# Patient Record
Sex: Male | Born: 2005 | Race: White | Hispanic: No | Marital: Single | Smoking: Never smoker
Health system: Southern US, Community
[De-identification: ages and names within clinical notes are randomized; demographics above are authoritative.]

## PROBLEM LIST (undated history)

## (undated) DIAGNOSIS — F909 Attention-deficit hyperactivity disorder, unspecified type: Secondary | ICD-10-CM

## (undated) HISTORY — DX: Attention-deficit hyperactivity disorder, unspecified type: F90.9

---
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
## 2024-01-02 ENCOUNTER — Ambulatory Visit: Payer: Self-pay

## 2024-01-02 NOTE — Telephone Encounter (Signed)
 Chief Complaint: Burning with urination Symptoms: burning with urination Frequency: comes and goes onset Monday  Pertinent Negatives: Patient denies fever, pus, discharge, nausea, vomiting Disposition: [] ED /[] Urgent Care (no appt availability in office) / [x] Appointment(In office/virtual)/ []  [facility]/ [] Home Care/ [] Refused Recommended Disposition /[] [facility]/ []  Follow-up with PCP Additional Notes: Patient states he is experiencing burning with urination that started on Monday. Patient denies all other symptoms. Care advice was given and appointment has been scheduled for evaluation tomorrow morning at 1005.    Copied from CRM [id number]. Topic: Clinical - Red Word Triage >> Jan 02, 2024  4:28 PM Emylou G wrote: [facility] that prompted transfer to Nurse Triage: Burning when urinates Reason for Disposition  All other males with painful urination  Answer Assessment - Initial Assessment Questions 1. SEVERITY: "How bad is the pain?"  (e.g., Scale 1-10; mild, moderate, or severe)   - MILD (1-3): Complains slightly about urination hurting.   - MODERATE (4-7): Interferes with normal activities.     - SEVERE (8-10): Excruciating, unwilling or unable to urinate because of the pain.      Moderated  2. FREQUENCY: "How many times have you had painful urination today?"      4-5 3. PATTERN: "Is pain present every time you urinate or just sometimes?"      Sometimes  4. ONSET: "When did the painful urination start?"      Monday  5. FEVER: "Do you have a fever?" If Yes, ask: "What is your temperature, how was it measured, and when did it start?"     No  6. PAST UTI: "Have you had a urine infection before?" If Yes, ask: "When was the last time?" and "What happened that time?"      No 7. CAUSE: "What do you think is causing the painful urination?"      Maybe a UTI 8. OTHER SYMPTOMS: "Do you have any other symptoms?" (e.g., flank pain, penis discharge, scrotal pain, blood in  urine)     Burning,  Protocols used: Urination Pain - Male-A-AH

## 2024-01-03 ENCOUNTER — Ambulatory Visit (INDEPENDENT_AMBULATORY_CARE_PROVIDER_SITE_OTHER)

## 2024-01-03 ENCOUNTER — Ambulatory Visit (HOSPITAL_COMMUNITY)
Admission: RE | Admit: 2024-01-03 | Discharge: 2024-01-03 | Disposition: A | Discharge status: Home / Self Care | Source: Ambulatory Visit | Attending: Family Medicine | Admitting: Family Medicine

## 2024-01-03 ENCOUNTER — Encounter: Payer: Self-pay | Admitting: Family Medicine

## 2024-01-03 VITALS — BP 121/72 | HR 57 | Temp 97.9°F | Ht 68.24 in | Wt 160.0 lb

## 2024-01-03 DIAGNOSIS — K5900 Constipation, unspecified: Secondary | ICD-10-CM

## 2024-01-03 DIAGNOSIS — R109 Unspecified abdominal pain: Secondary | ICD-10-CM

## 2024-01-03 DIAGNOSIS — R300 Dysuria: Secondary | ICD-10-CM | POA: Diagnosis not present

## 2024-01-03 LAB — URINALYSIS, ROUTINE W REFLEX MICROSCOPIC
Bilirubin, UA: NEGATIVE
Glucose, UA: NEGATIVE
Ketones, UA: NEGATIVE
Leukocytes,UA: NEGATIVE
Nitrite, UA: NEGATIVE
RBC, UA: NEGATIVE
Specific Gravity, UA: 1.02 (ref 1.005–1.030)
Urobilinogen, Ur: 1 mg/dL (ref 0.2–1.0)
pH, UA: 7 (ref 5.0–7.5)

## 2024-01-03 LAB — MICROSCOPIC EXAMINATION
Bacteria, UA: NONE SEEN
RBC, Urine: NONE SEEN /HPF (ref 0–2)
Renal Epithel, UA: NONE SEEN /HPF

## 2024-01-03 MED ORDER — POLYETHYLENE GLYCOL 3350 17 GM/SCOOP PO POWD: 17.0000 g | Freq: Every day | ORAL | 1 refills | Status: DC

## 2024-01-03 NOTE — Progress Notes (Signed)
 Subjective:  Patient ID: Johnny Wade, male    DOB: 12/01/2005, 18 y.o.   MRN: [id number]  Patient Care Team: Junie Spencer, FNP as PCP - General (Family Medicine)   Chief Complaint:  Dysuria (X 3 days )   HPI: Johnny Wade is a 18 y.o. male presenting on 01/03/2024 for Dysuria (X 3 days )   Discussed the use of AI scribe software for clinical note transcription with the patient, who gave verbal consent to proceed.  History of Present Illness   Johnny Wade is an 18 year old male who presents with symptoms suggestive of a bladder infection.  He experiences sharp pains that occur either when he gets up or sometimes when he walks around. The pain originates from the left side and radiates down under the groin, with a similar pain occurring in the flank area this morning. No history of kidney stones is reported.  He has burning during urination at times and urinates about four to five times a day. His urine has been orange colored since this morning, described as having a color similar to dark yellow or brown. No nausea, vomiting, or sweatiness. He denies any discharge, rashes, or swelling in the testicles. He is not sexually active and has never been sexually active. His last bowel movement was yesterday, with no issues reported.   Family history is significant for his father having had a kidney stone.          Relevant past medical, surgical, family, and social history reviewed and updated as indicated.  Allergies and medications reviewed and updated. Data reviewed: Chart in Epic.   Past Medical History:  Diagnosis Date   ADHD (attention deficit hyperactivity disorder)     History reviewed. No pertinent surgical history.  Social History   Socioeconomic History   Marital status: Single    Spouse name: Not on file   Number of children: Not on file   Years of education: Not on file   Highest education level: Not on file  Occupational History   Not on file  Tobacco  Use   Smoking status: Never    Passive exposure: Yes   Smokeless tobacco: Never  Vaping Use   Vaping status: Never Used  Substance and Sexual Activity   Alcohol use: No   Drug use: Not on file   Sexual activity: Not on file  Other Topics Concern   Not on file  Social History Narrative   Not on file   Social Drivers of Health   Financial Resource Strain: Not on file  Food Insecurity: Not on file  Transportation Needs: Not on file  Physical Activity: Not on file  Stress: Not on file  Social Connections: Not on file  Intimate Partner Violence: Not on file    Outpatient Encounter Medications as of 01/03/2024  Medication Sig   polyethylene glycol powder (GLYCOLAX/MIRALAX) 17 GM/SCOOP powder Take 17 g by mouth daily.   famotidine (PEPCID) 20 MG tablet Take 1 tablet (20 mg total) by mouth 2 (two) times daily. (Patient not taking: Reported on 01/03/2024)   ipratropium (ATROVENT) 0.03 % nasal spray PLACE 2 SPRAYS INTO BOTH NOSTRILS EVERY 12 (TWELVE) HOURS. (Patient not taking: Reported on 01/03/2024)   No facility-administered encounter medications on file as of 01/03/2024.    Allergies  Allergen Reactions   Flonase [Fluticasone]     Nose bleeds    Pertinent ROS per HPI, otherwise unremarkable      Objective:  BP 121/72  Pulse (!) 57   Temp 97.9 F (36.6 C)   Ht 5' 8.24" (1.733 m)   Wt 160 lb (72.6 kg)   SpO2 98%   BMI 24.16 kg/m    Wt Readings from Last 3 Encounters:  01/03/24 160 lb (72.6 kg) (63%, Z= 0.33)*  04/21/21 152 lb (68.9 kg) (74%, Z= 0.65)*  03/02/20 153 lb (69.4 kg) (86%, Z= 1.09)*   * Growth percentiles are based on CDC (Boys, 2-20 Years) data.    Physical Exam Vitals and nursing note reviewed.  Constitutional:      General: He is not in acute distress.    Appearance: Normal appearance. He is not ill-appearing, toxic-appearing or diaphoretic.  HENT:     Head: Normocephalic and atraumatic.     Nose: Nose normal.     Mouth/Throat:     Mouth:  Mucous membranes are moist.  Eyes:     Conjunctiva/sclera: Conjunctivae normal.     Pupils: Pupils are equal, round, and reactive to light.  Cardiovascular:     Rate and Rhythm: Normal rate and regular rhythm.     Heart sounds: Normal heart sounds.  Pulmonary:     Effort: Pulmonary effort is normal.     Breath sounds: Normal breath sounds.  Abdominal:     General: Abdomen is flat. Bowel sounds are normal.     Palpations: Abdomen is soft.     Tenderness: There is generalized abdominal tenderness. There is no right CVA tenderness, left CVA tenderness, guarding or rebound.     Hernia: No hernia is present.  Skin:    General: Skin is warm and dry.     Capillary Refill: Capillary refill takes less than 2 seconds.  Neurological:     General: No focal deficit present.     Mental Status: He is alert and oriented to person, place, and time.  Psychiatric:        Mood and Affect: Mood normal.        Behavior: Behavior normal.        Thought Content: Thought content normal.        Judgment: Judgment normal.      Results for orders placed or performed in visit on 10/25/18  Lipid panel   Collection Time: 10/25/18  2:40 PM  Result Value Ref Range   Cholesterol, Total 121 100 - 169 mg/dL   Triglycerides 74 0 - 89 mg/dL   HDL 50 >25 mg/dL   VLDL Cholesterol Cal 15 5 - 40 mg/dL   LDL Calculated 56 0 - 109 mg/dL   Chol/HDL Ratio 2.4 0.0 - 5.0 ratio       Pertinent labs & imaging results that were available during my care of the patient were reviewed by me and considered in my medical decision making.  Assessment & Plan:  Johnny Wade was seen today for dysuria.  Diagnoses and all orders for this visit:  Dysuria -     Urinalysis, Routine w reflex microscopic -     Urine Culture -     Cancel: DG Abd 1 View -     DG Abd 1 View  Left flank pain -     Cancel: DG Abd 1 View -     DG Abd 1 View  Constipation in male -     polyethylene glycol powder (GLYCOLAX/MIRALAX) 17 GM/SCOOP powder;  Take 17 g by mouth daily.     Assessment and Plan    Left Flank Pain Johnny Wade presents with sharp left  flank pain radiating to the groin, accompanied by dysuria and dark colored urine. Initial suspicion of nephrolithiasis was not confirmed by imaging or urinalysis, which instead revealed significant constipation potentially contributing to the pain. - Order KUB to evaluate for nephrolithiasis and constipation - Advise increased fluid intake - Prescribe Miralax for constipation  Constipation Imaging confirmed significant constipation, potentially contributing to left flank pain despite normal bowel movements. - Prescribe Miralax - Advise increased fluid intake  Hematuria Johnny Wade reports hematuria since this morning. Initial suspicion of nephrolithiasis was not confirmed by imaging. No blood on urinalysis. Plan to monitor and reassess if symptoms persist.   Dysuria Johnny Wade experiences dysuria, initially raising suspicion for nephrolithiasis, which was not confirmed by imaging. Increased fluid intake is advised to alleviate symptoms. - Advise increased fluid intake  Follow-up Urinalysis unremarkable. Follow-up is necessary to evaluate treatment response and reassess symptoms. KUB results will guide further management. - Schedule follow-up appointment post-KUB results - Contact Johnny Wade with results and further instructions          Continue all other maintenance medications.  Follow up plan: Return if symptoms worsen or fail to improve.   Continue healthy lifestyle choices, including diet (rich in fruits, vegetables, and lean proteins, and low in salt and simple carbohydrates) and exercise (at least 30 minutes of moderate physical activity daily).   The above assessment and management plan was discussed with the patient. The patient verbalized understanding of and has agreed to the management plan. Patient is aware to call the clinic if they develop any new symptoms or if symptoms  persist or worsen. Patient is aware when to return to the clinic for a follow-up visit. Patient educated on when it is appropriate to go to the emergency department.   Kari Baars, FNP-C [facility] [phone]

## 2024-01-05 LAB — URINE CULTURE

## 2024-02-04 DIAGNOSIS — R1010 Upper abdominal pain, unspecified: Secondary | ICD-10-CM | POA: Diagnosis not present

## 2024-02-04 DIAGNOSIS — K589 Irritable bowel syndrome without diarrhea: Secondary | ICD-10-CM | POA: Diagnosis not present

## 2024-02-04 DIAGNOSIS — R109 Unspecified abdominal pain: Secondary | ICD-10-CM | POA: Diagnosis not present

## 2024-02-04 DIAGNOSIS — K219 Gastro-esophageal reflux disease without esophagitis: Secondary | ICD-10-CM | POA: Diagnosis not present

## 2024-02-08 ENCOUNTER — Encounter: Payer: Self-pay | Admitting: Nurse Practitioner

## 2024-02-08 ENCOUNTER — Ambulatory Visit (INDEPENDENT_AMBULATORY_CARE_PROVIDER_SITE_OTHER): Admitting: Nurse Practitioner

## 2024-02-08 VITALS — BP 106/69 | HR 57 | Temp 98.1°F | Ht 68.0 in | Wt 159.0 lb

## 2024-02-08 DIAGNOSIS — Z09 Encounter for follow-up examination after completed treatment for conditions other than malignant neoplasm: Secondary | ICD-10-CM | POA: Diagnosis not present

## 2024-02-08 DIAGNOSIS — K219 Gastro-esophageal reflux disease without esophagitis: Secondary | ICD-10-CM | POA: Diagnosis not present

## 2024-02-08 MED ORDER — PANTOPRAZOLE SODIUM 40 MG PO TBEC: 40.0000 mg | DELAYED_RELEASE_TABLET | Freq: Every day | ORAL | 1 refills | Status: DC

## 2024-02-08 NOTE — Progress Notes (Signed)
   Subjective:    Patient ID: Johnny Wade, male    DOB: 2006-02-24, 18 y.o.   MRN: [id number]   Chief Complaint: Hospitalization Follow-up   HPI  Patient went to ED in [place] with abdominal pain. All scans were negative according to patient. Was dx with GERD. They started him on protonix. He says he is doing much better. Patient Active Problem List   Diagnosis Date Noted   Insomnia 04/21/2021   GAD (generalized anxiety disorder) 03/13/2019   Attention deficit hyperactivity disorder 11/10/2013       Review of Systems  Constitutional:  Negative for diaphoresis.  Eyes:  Negative for pain.  Respiratory:  Negative for shortness of breath.   Cardiovascular:  Negative for chest pain, palpitations and leg swelling.  Gastrointestinal:  Negative for abdominal pain.  Endocrine: Negative for polydipsia.  Skin:  Negative for rash.  Neurological:  Negative for dizziness, weakness and headaches.  Hematological:  Does not bruise/bleed easily.  All other systems reviewed and are negative.      Objective:   Physical Exam Constitutional:      Appearance: Normal appearance.  Cardiovascular:     Rate and Rhythm: Normal rate and regular rhythm.     Heart sounds: Normal heart sounds.  Pulmonary:     Effort: Pulmonary effort is normal.     Breath sounds: Normal breath sounds.  Abdominal:     General: Abdomen is flat. Bowel sounds are normal.     Palpations: Abdomen is soft.  Skin:    General: Skin is warm.  Neurological:     General: No focal deficit present.     Mental Status: He is alert and oriented to person, place, and time.  Psychiatric:        Mood and Affect: Mood normal.        Behavior: Behavior normal.   BP 106/69   Pulse (!) 57   Temp 98.1 F (36.7 C) (Temporal)   Ht 5\' 8"  (1.727 m)   Wt 159 lb (72.1 kg)   SpO2 93%   BMI 24.18 kg/m          Assessment & Plan:   Johnny Wade in today with chief complaint of Hospitalization Follow-up   1.  Gastroesophageal reflux disease without esophagitis (Primary) Avoid spicy foods Do not eat 2 hours prior to bedtime Meds ordered this encounter  Medications   pantoprazole (PROTONIX) 40 MG tablet    Sig: Take 1 tablet (40 mg total) by mouth daily.    Dispense:  90 tablet    Refill:  1    Supervising Provider:   Jolyne Needs A [[id number]]    2. Hospital follow up Records that were brought to appt were reviewed   The above assessment and management plan was discussed with the patient. The patient verbalized understanding of and has agreed to the management plan. Patient is aware to call the clinic if symptoms persist or worsen. Patient is aware when to return to the clinic for a follow-up visit. Patient educated on when it is appropriate to go to the emergency department.   Mary-Margaret Gaylyn Keas, FNP

## 2024-02-08 NOTE — Patient Instructions (Signed)
 GERD in Adults: Diet Changes When you have gastroesophageal reflux disease (GERD), you may need to make changes to your diet. Choosing the right foods can help with your symptoms. Think about working with an expert in healthy eating called a dietitian. They can help you make healthy food choices. What are tips for following this plan? Reading food labels Look for foods that are low in saturated fat. Foods that may help with your symptoms include: Foods with less than 5% of daily value (DV) of fat. Foods with 0 grams of trans fat. Cooking [employer] in ways that don't use a lot of fat. These ways include: Baking. Steaming. Grilling. Broiling. To add flavor, try to use herbs that are low in spice and acidity. Avoid frying your food. Meal planning  Eat small meals often rather than eating 3 large meals each day. Eat your meals slowly in a place where you feel relaxed. If told by your health care provider, avoid: Foods that cause symptoms. Keep a food diary to keep track of foods that cause symptoms. Alcohol. Drinking a lot of liquid with meals. General instructions For 2-3 hours after you eat, avoid: Bending over. Exercise. Lying down. Chew sugar-free gum after meals. What foods should I eat? Eat a healthy diet. Try to include: Foods with high amounts of fiber. These include: Fruits and vegetables. Whole grains and beans. Low-fat dairy products. Lean meats, fish, and poultry. Egg whites. Foods that cause symptoms in someone else may not cause symptoms for you. Work with your provider to find foods that are safe for you. The items listed above may not be all the foods and drinks you can have. Talk with a dietitian to learn more. The items listed above may not be a complete list of foods and beverages you can eat and drink. Contact a dietitian for more information. What foods should I avoid? Limiting some of these foods may help with your symptoms. Each person is different.  Talk with a dietitian or your provider to help you find the exact foods to avoid. Some of the foods to avoid may include: Fruits Fruits with a lot of acid in them. These may include citrus fruits, such as oranges, grapefruit, pineapple, and lemons. Vegetables Deep-fried vegetables, such as [place] fries. Vegetables, sauces, or toppings made with added fat and vegetables with acid in them. These may include tomatoes and tomato products, chili peppers, onions, garlic, and horseradish. Grains Pastries or quick breads with added fat. Meats and other proteins High-fat meats, such as fatty beef or pork, hot dogs, ribs, ham, sausage, salami, and bacon. Fried meat or protein, such as fried fish and fried chicken. Egg yolks. Fats and oils Butter. Margarine. Shortening. Ghee. Drinks Coffee and other drinks with caffeine in them. Fizzy and sugary drinks, such as soda and energy drinks. Fruit juice made with acidic fruits, such as orange or grapefruit. Tomato juice. Sweets and desserts Chocolate and cocoa. Donuts. Seasonings and condiments Mint, such as peppermint and spearmint. Condiments, herbs, or seasonings that cause symptoms. These may include curry, hot sauce, or vinegar-based salad dressings. The items listed above may not be all the foods and drinks you should avoid. Talk with a dietitian to learn more. Questions to ask your health care provider Changes to your diet and everyday life are often the first steps taken to manage symptoms of GERD. If these changes don't help, talk with your provider about taking medicines. Where to find more information International Foundation for Gastrointestinal Disorders:  aboutgerd.org This information is not intended to replace advice given to you by your health care provider. Make sure you discuss any questions you have with your health care provider. Document Revised: 07/24/2023 Document Reviewed: 02/07/2023 Elsevier Patient Education  2024 [employer].

## 2024-06-19 ENCOUNTER — Encounter: Payer: Self-pay | Admitting: Family

## 2024-06-19 ENCOUNTER — Ambulatory Visit (INDEPENDENT_AMBULATORY_CARE_PROVIDER_SITE_OTHER): Admitting: Family

## 2024-06-19 VITALS — BP 119/76 | HR 60 | Temp 97.9°F | Ht 68.0 in | Wt 165.2 lb

## 2024-06-19 DIAGNOSIS — B084 Enteroviral vesicular stomatitis with exanthem: Secondary | ICD-10-CM

## 2024-06-19 DIAGNOSIS — J40 Bronchitis, not specified as acute or chronic: Secondary | ICD-10-CM | POA: Diagnosis not present

## 2024-06-19 NOTE — Patient Instructions (Signed)
Hand, Foot, and Mouth Disease, Adult Hand, foot, and mouth disease is an illness caused by a germ called a virus. It mostly happens in children younger than 19 years old. But older children and adults can get it too. It can cause: Sores in your mouth. A rash on your hands and feet. Most people get better within 1-2 weeks. What are the causes? Hand, foot, and mouth disease is contagious. That means it spreads easily from person to person. You may get it through contact with: The snot, spit, or poop of an infected person. A surface that has the germs on it. You're most contagious during the first week that you're sick. What are the signs or symptoms? Small sores in your mouth. These may hurt. A rash on your hands and feet. Sometimes, you may also get a rash on your butt, arms, legs, or other parts of your body. The rash may look like small red bumps or sores. The bumps may blister. Fever. Sore throat. Body aches or headaches. Not feeling as hungry. How is this diagnosed? Hand, foot, and mouth disease may be diagnosed with an exam. Your health care provider will look at the rash and mouth sores. In some cases, a poop sample or a swab of your throat may be taken. How is this treated? In most cases, no treatment is needed. But your provider may suggest: Medicines to help with pain and fever. A mouth rinse. This may help with pain. Follow these instructions at home: Managing pain and discomfort  Gargle with a mixture of salt and water 3-4 times a day or as needed. To make salt water, completely dissolve -1 tsp (3-6 g) of salt in 1 cup (237 mL) of warm water. To help with pain while eating: Eat soft foods. Stay away from foods and drinks that are salty or spicy. Stay away from foods and drinks that have acid in them, such as pickles and orange juice. Avoid alcohol. Eat cold food and drinks. These include water, milk, milkshakes, frozen ice pops, slushies, sherbets, and low-calorie sports  drinks. Relieving itching Keep cool and out of the sun. Sweating and feeling hot can make itching worse. Cool baths can help. Try adding baking soda or dry oatmeal to the water. Do not bathe in hot water. Put cold, wet cloths called cold compresses on itchy spots, as told by your provider. Use calamine lotion as told by your provider. This is a lotion you can get at the store to help with itching. Make sure you don't scratch or pick at your rash. To help stop scratching: Keep your fingernails clean and cut short. Wear soft gloves or mittens when you sleep. General instructions  Take or apply over-the-counter and prescription medicines only as told by your provider. Wash your hands often with soap and water for at least 20 seconds. If soap and water aren't available, use hand sanitizer. Clean surfaces and shared items that you touch a lot. Stay away from work, schools, and other groups for a few days or until your fever is gone for at least 24 hours. Return to your normal activities as told by your provider. Ask your provider what activities are safe for you. Contact a health care provider if: Your symptoms get worse. Your symptoms don't get better within 2 weeks. Your pain doesn't get better with medicine. You have trouble swallowing. You get sores or blisters on your lips or outside your mouth. You have a fever for more than 3 days. Get  help right away if: You show signs of very bad dehydration. Signs include: Peeing only very small amounts or peeing less than 3 times in 24 hours. Pee (urine) that's very dark. Dry mouth, tongue, or lips. Fewer tears or sunken eyes. Dry skin. Fast breathing. Not being active or being very sleepy. Pale skin. Fingertips that take more than 2 seconds to turn pink again after a gentle squeeze. Weight loss. You have a severe headache. You have a stiff neck. You start to act in a way that isn't normal. You have chest pain or trouble breathing. These  symptoms may be an emergency. Get help right away. Call 911. Do not wait to see if the symptoms will go away. Do not drive yourself to the hospital. This information is not intended to replace advice given to you by your health care provider. Make sure you discuss any questions you have with your health care provider. Document Revised: 12/07/2022 Document Reviewed: 12/07/2022 Elsevier Patient Education  2024 [employer].

## 2024-06-19 NOTE — Progress Notes (Signed)
 Subjective:    Patient ID: Johnny Wade, male    DOB: 08/31/2006, 18 y.o.   MRN: [id number]  Chief Complaint  Patient presents with   Follow-up    Hands foot and mouth and bronchitis    PT presents to the office today for Urgent Care follow up. He went to the Urgent Care yesterday for coughing and a rash on bilateral hands and feet.   He was diagnosed with Acute Bronchitis and Hand, Foot, and Mouth disease. His sister was diagnosed with Hand, Foot, and Mouth disease last week.   He reports mild aching pain of 8 out 10.   He was given an inhaler and prednisone 40 mg for 5 days.  Rash This is a new problem. The current episode started yesterday. The problem is unchanged. The affected locations include the left foot, left hand, right hand and right foot. The rash is characterized by pain. He was exposed to an ill contact. Associated symptoms include coughing, fatigue and a fever. Pertinent negatives include no congestion, eye pain, facial edema, rhinorrhea, shortness of breath or sore throat. Past treatments include oral steroids. The treatment provided mild relief.      Review of Systems  Constitutional:  Positive for fatigue and fever.  HENT:  Negative for congestion, rhinorrhea and sore throat.   Eyes:  Negative for pain.  Respiratory:  Positive for cough. Negative for shortness of breath.   Skin:  Positive for rash.  All other systems reviewed and are negative.   Social History   Socioeconomic History   Marital status: Single    Spouse name: Not on file   Number of children: Not on file   Years of education: Not on file   Highest education level: Not on file  Occupational History   Not on file  Tobacco Use   Smoking status: Never    Passive exposure: Yes   Smokeless tobacco: Never  Vaping Use   Vaping status: Never Used  Substance and Sexual Activity   Alcohol use: No   Drug use: Not on file   Sexual activity: Not on file  Other Topics Concern   Not on file   Social History Narrative   Not on file   Social Drivers of Health   Financial Resource Strain: Not on file  Food Insecurity: Not on file  Transportation Needs: Not on file  Physical Activity: Not on file  Stress: Not on file  Social Connections: Not on file   Family History  Problem Relation Age of Onset   COPD Mother    Depression Mother    Heart murmur Mother         Objective:   Physical Exam Vitals reviewed.  Constitutional:      General: He is not in acute distress.    Appearance: He is well-developed.  HENT:     Head: Normocephalic.     Right Ear: Tympanic membrane normal.     Left Ear: Tympanic membrane normal.     Mouth/Throat:      Comments: Blister on tongue and inner lip Eyes:     General:        Right eye: No discharge.        Left eye: No discharge.     Pupils: Pupils are equal, round, and reactive to light.  Neck:     Thyroid: No thyromegaly.  Cardiovascular:     Rate and Rhythm: Normal rate and regular rhythm.     Heart sounds: Normal  heart sounds. No murmur heard. Pulmonary:     Effort: Pulmonary effort is normal. No respiratory distress.     Breath sounds: Normal breath sounds. No wheezing.  Abdominal:     General: Bowel sounds are normal. There is no distension.     Palpations: Abdomen is soft.     Tenderness: There is no abdominal tenderness.  Musculoskeletal:        General: No tenderness. Normal range of motion.     Cervical back: Normal range of motion and neck supple.  Skin:    General: Skin is warm and dry.     Findings: Rash present. No erythema. Rash is vesicular.     Comments: Blister rash on bilateral hands and left foot  Neurological:     Mental Status: He is alert and oriented to person, place, and time.     Cranial Nerves: No cranial nerve deficit.     Deep Tendon Reflexes: Reflexes are normal and symmetric.  Psychiatric:        Behavior: Behavior normal.        Thought Content: Thought content normal.        Judgment:  Judgment normal.       BP 119/76   Pulse 60   Temp 97.9 F (36.6 C) (Temporal)   Ht 5' 8 (1.727 m)   Wt 165 lb 3.2 oz (74.9 kg)   BMI 25.12 kg/m      Assessment & Plan:  Lyndol Vanderheiden comes in today with chief complaint of Follow-up (Hands foot and mouth and bronchitis )   Diagnosis and orders addressed:   1. Hand, foot and mouth disease (Primary)  2. Bronchitis  Urgent Care notes reviewed Continue albuterol and prednisone  Force fluids Rest Motrin as needed for pain and fevers  Follow up if symptoms worsen or do not improve    Bari Learn, FNP

## 2024-08-04 ENCOUNTER — Ambulatory Visit: Payer: Self-pay | Admitting: Family

## 2024-08-04 ENCOUNTER — Ambulatory Visit (INDEPENDENT_AMBULATORY_CARE_PROVIDER_SITE_OTHER): Admitting: Family

## 2024-08-04 ENCOUNTER — Encounter: Payer: Self-pay | Admitting: Family

## 2024-08-04 VITALS — BP 138/78 | HR 69 | Temp 97.6°F | Ht 68.0 in | Wt 164.0 lb

## 2024-08-04 DIAGNOSIS — J069 Acute upper respiratory infection, unspecified: Secondary | ICD-10-CM

## 2024-08-04 DIAGNOSIS — K219 Gastro-esophageal reflux disease without esophagitis: Secondary | ICD-10-CM

## 2024-08-04 DIAGNOSIS — R0602 Shortness of breath: Secondary | ICD-10-CM | POA: Diagnosis not present

## 2024-08-04 LAB — VERITOR SARS-COV-2 AND FLU A+B
BD Veritor SARS-CoV-2 Ag: NEGATIVE
Influenza A: NEGATIVE
Influenza B: NEGATIVE

## 2024-08-04 MED ORDER — ALBUTEROL SULFATE HFA 108 (90 BASE) MCG/ACT IN AERS: 2.0000 | INHALATION_SPRAY | Freq: Four times a day (QID) | RESPIRATORY_TRACT | 2 refills | Status: AC | PRN

## 2024-08-04 MED ORDER — CETIRIZINE HCL 10 MG PO TABS: 10.0000 mg | ORAL_TABLET | Freq: Every day | ORAL | 3 refills | Status: AC

## 2024-08-04 MED ORDER — TRIAMCINOLONE ACETONIDE 0.1 % EX CREA: 1.0000 | TOPICAL_CREAM | Freq: Two times a day (BID) | CUTANEOUS | 0 refills | Status: AC

## 2024-08-04 MED ORDER — PANTOPRAZOLE SODIUM 40 MG PO TBEC: 40.0000 mg | DELAYED_RELEASE_TABLET | Freq: Every day | ORAL | 1 refills | Status: AC

## 2024-08-04 MED ORDER — IPRATROPIUM BROMIDE 0.03 % NA SOLN: 2.0000 | Freq: Two times a day (BID) | NASAL | 2 refills | Status: AC

## 2024-08-04 NOTE — Progress Notes (Signed)
 Subjective:    Patient ID: Johnny Wade, male    DOB: 12-09-2005, 18 y.o.   MRN: [id number]  Chief Complaint  Patient presents with   Rash   Cough   Shortness of Breath   Nasal Congestion   Diarrhea   Chills   PT presents to the office today with flu like symptoms that started two days ago. Rash Associated symptoms include congestion, coughing, diarrhea, rhinorrhea, shortness of breath and a sore throat. Pertinent negatives include no joint pain.  Cough Associated symptoms include headaches, heartburn, a rash, rhinorrhea, a sore throat and shortness of breath. Pertinent negatives include no ear pain or wheezing.  Shortness of Breath Associated symptoms include headaches, a rash, rhinorrhea and a sore throat. Pertinent negatives include no ear pain, swollen glands or wheezing.  Diarrhea  Associated symptoms include coughing, headaches and a URI.  URI  This is a new problem. The current episode started in the past 7 days. The problem has been unchanged. There has been no fever. Associated symptoms include congestion, coughing, diarrhea, headaches, a rash, rhinorrhea, sinus pain, sneezing and a sore throat. Pertinent negatives include no ear pain, joint pain, joint swelling, swollen glands or wheezing. He has tried sleep for the symptoms. The treatment provided mild relief.  Gastroesophageal Reflux He complains of belching, coughing, heartburn and a sore throat. He reports no wheezing. This is a chronic problem. The current episode started more than 1 year ago.      Review of Systems  HENT:  Positive for congestion, rhinorrhea, sinus pain, sneezing and sore throat. Negative for ear pain.   Respiratory:  Positive for cough and shortness of breath. Negative for wheezing.   Gastrointestinal:  Positive for diarrhea and heartburn.  Musculoskeletal:  Negative for joint pain.  Skin:  Positive for rash.  Neurological:  Positive for headaches.  All other systems reviewed and are  negative.   Social History   Socioeconomic History   Marital status: Single    Spouse name: Not on file   Number of children: Not on file   Years of education: Not on file   Highest education level: Not on file  Occupational History   Not on file  Tobacco Use   Smoking status: Never    Passive exposure: Yes   Smokeless tobacco: Never  Vaping Use   Vaping status: Never Used  Substance and Sexual Activity   Alcohol use: No   Drug use: Not on file   Sexual activity: Not on file  Other Topics Concern   Not on file  Social History Narrative   Not on file   Social Drivers of Health   Financial Resource Strain: Not on file  Food Insecurity: Not on file  Transportation Needs: Not on file  Physical Activity: Not on file  Stress: Not on file  Social Connections: Not on file   Family History  Problem Relation Age of Onset   COPD Mother    Depression Mother    Heart murmur Mother         Objective:   Physical Exam Vitals reviewed.  Constitutional:      General: He is not in acute distress.    Appearance: He is well-developed.  HENT:     Head: Normocephalic.     Comments: Nasal congestion    Right Ear: Tympanic membrane normal.     Left Ear: Tympanic membrane normal.  Eyes:     General:        Right eye:  No discharge.        Left eye: No discharge.     Pupils: Pupils are equal, round, and reactive to light.  Neck:     Thyroid: No thyromegaly.  Cardiovascular:     Rate and Rhythm: Normal rate and regular rhythm.     Heart sounds: Normal heart sounds. No murmur heard. Pulmonary:     Effort: Pulmonary effort is normal. No respiratory distress.     Breath sounds: Normal breath sounds. No wheezing.     Comments: Dry nonproductive cough Abdominal:     General: Bowel sounds are normal. There is no distension.     Palpations: Abdomen is soft.     Tenderness: There is no abdominal tenderness.  Musculoskeletal:        General: No tenderness. Normal range of  motion.     Cervical back: Normal range of motion and neck supple.  Skin:    General: Skin is warm and dry.     Findings: Rash present. No erythema.         Comments: Erythemas rash on chest and neck  Neurological:     Mental Status: He is alert and oriented to person, place, and time.     Cranial Nerves: No cranial nerve deficit.     Deep Tendon Reflexes: Reflexes are normal and symmetric.  Psychiatric:        Behavior: Behavior normal.        Thought Content: Thought content normal.        Judgment: Judgment normal.       BP 138/78   Pulse 69   Temp 97.6 F (36.4 C) (Temporal)   Ht 5' 8 (1.727 m)   Wt 164 lb (74.4 kg)   SpO2 96%   BMI 24.94 kg/m      Assessment & Plan:  Johnny Wade comes in today with chief complaint of Rash, Cough, Shortness of Breath, Nasal Congestion, Diarrhea, and Chills   Diagnosis and orders addressed:  1. SOB (shortness of breath) - Veritor SARS-CoV-2 and Flu A+B - albuterol (VENTOLIN HFA) 108 (90 Base) MCG/ACT inhaler; Inhale 2 puffs into the lungs every 6 (six) hours as needed for wheezing or shortness of breath.  Dispense: 8 g; Refill: 2  2. Gastroesophageal reflux disease, unspecified whether esophagitis present Restart protonix   -Diet discussed- Avoid fried, spicy, citrus foods, caffeine and alcohol -Do not eat 2-3 hours before bedtime -Encouraged small frequent meals -Avoid NSAID's - pantoprazole  (PROTONIX ) 40 MG tablet; Take 1 tablet (40 mg total) by mouth daily.  Dispense: 90 tablet; Refill: 1  3. Viral URI (Primary) - Take meds as prescribed - Use a cool mist humidifier  -Use saline nose sprays frequently -Force fluids -For any cough or congestion  Use plain Mucinex- regular strength or max strength is fine -For fever or aces or pains- take tylenol or ibuprofen. -Throat lozenges if help -Follow up if symptoms worsen or do not improve  - albuterol (VENTOLIN HFA) 108 (90 Base) MCG/ACT inhaler; Inhale 2 puffs into the lungs  every 6 (six) hours as needed for wheezing or shortness of breath.  Dispense: 8 g; Refill: 2 - ipratropium (ATROVENT ) 0.03 % nasal spray; Place 2 sprays into both nostrils 2 (two) times daily.  Dispense: 30 mL; Refill: 2 - cetirizine  (ZYRTEC ) 10 MG tablet; Take 1 tablet (10 mg total) by mouth daily.  Dispense: 90 tablet; Refill: 3      Bari Learn, FNP

## 2024-08-04 NOTE — Patient Instructions (Signed)
